# Patient Record
Sex: Male | Born: 2000 | Hispanic: Yes | Marital: Single | Smoking: Never smoker
Health system: Southern US, Community
[De-identification: ages and names within clinical notes are randomized; demographics above are authoritative.]

## PROBLEM LIST (undated history)

## (undated) DIAGNOSIS — J45909 Unspecified asthma, uncomplicated: Secondary | ICD-10-CM

---
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
## 2021-01-14 ENCOUNTER — Emergency Department: Payer: 59

## 2021-01-14 ENCOUNTER — Encounter: Payer: Self-pay | Admitting: Emergency Medicine

## 2021-01-14 ENCOUNTER — Emergency Department
Admission: EM | Admit: 2021-01-14 | Discharge: 2021-01-14 | Disposition: A | Discharge status: Home / Self Care | Payer: 59 | Attending: Emergency Medicine | Admitting: Emergency Medicine

## 2021-01-14 ENCOUNTER — Other Ambulatory Visit: Payer: Self-pay

## 2021-01-14 DIAGNOSIS — M7989 Other specified soft tissue disorders: Secondary | ICD-10-CM | POA: Diagnosis not present

## 2021-01-14 DIAGNOSIS — Y92410 Unspecified street and highway as the place of occurrence of the external cause: Secondary | ICD-10-CM | POA: Diagnosis not present

## 2021-01-14 DIAGNOSIS — S6991XA Unspecified injury of right wrist, hand and finger(s), initial encounter: Secondary | ICD-10-CM | POA: Diagnosis not present

## 2021-01-14 DIAGNOSIS — M79641 Pain in right hand: Secondary | ICD-10-CM | POA: Diagnosis not present

## 2021-01-14 DIAGNOSIS — J45909 Unspecified asthma, uncomplicated: Secondary | ICD-10-CM | POA: Insufficient documentation

## 2021-01-14 DIAGNOSIS — S60311A Abrasion of right thumb, initial encounter: Secondary | ICD-10-CM | POA: Diagnosis not present

## 2021-01-14 DIAGNOSIS — S80211A Abrasion, right knee, initial encounter: Secondary | ICD-10-CM | POA: Diagnosis not present

## 2021-01-14 DIAGNOSIS — S50812A Abrasion of left forearm, initial encounter: Secondary | ICD-10-CM | POA: Insufficient documentation

## 2021-01-14 DIAGNOSIS — Z23 Encounter for immunization: Secondary | ICD-10-CM | POA: Insufficient documentation

## 2021-01-14 DIAGNOSIS — M25561 Pain in right knee: Secondary | ICD-10-CM | POA: Diagnosis not present

## 2021-01-14 MED ORDER — ACETAMINOPHEN 500 MG PO TABS
1000.0000 mg | ORAL_TABLET | Freq: Once | ORAL | Status: AC
  Administered 2021-01-14: 1000 mg via ORAL
  Filled 2021-01-14: qty 2

## 2021-01-14 MED ORDER — TETANUS-DIPHTH-ACELL PERTUSSIS 5-2.5-18.5 LF-MCG/0.5 IM SUSY
0.5000 mL | PREFILLED_SYRINGE | Freq: Once | INTRAMUSCULAR | Status: AC
  Administered 2021-01-14: 0.5 mL via INTRAMUSCULAR
  Filled 2021-01-14: qty 0.5

## 2021-01-14 NOTE — ED Notes (Signed)
Wound to right thumb cleansed prior to splint application. Non adherent dressing applied. Splint applied by Misty Stanley, EDNT. CMS WNL to right hand post thumb spica splint. Patient denies discomfort. Patient wound to left arm cleansed and wrapped by this RN as well.
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
## 2021-01-14 NOTE — Discharge Instructions (Addendum)
Your x-ray was negative for fracture although I am concerned about the possibility of a scaphoid fracture given your tenderness.  You need to leave your splint on and follow-up with orthopedic surgery in 2 to 3 weeks for repeat x-ray.  Return to the ER if you develop headaches, nausea, vomiting or any other concern  Take Tylenol 1 g every 8 hours and ibuprofen 600 every 6 hours with food.  Return the ER for any other concern

## 2021-01-14 NOTE — ED Notes (Signed)
ED Provider at bedside. 
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
## 2021-12-11 DIAGNOSIS — Z00129 Encounter for routine child health examination without abnormal findings: Secondary | ICD-10-CM | POA: Diagnosis not present

## 2022-01-13 DIAGNOSIS — M7989 Other specified soft tissue disorders: Secondary | ICD-10-CM | POA: Diagnosis not present

## 2022-01-13 DIAGNOSIS — M25471 Effusion, right ankle: Secondary | ICD-10-CM | POA: Diagnosis not present

## 2022-01-13 DIAGNOSIS — M79671 Pain in right foot: Secondary | ICD-10-CM | POA: Diagnosis not present

## 2022-01-13 DIAGNOSIS — M25571 Pain in right ankle and joints of right foot: Secondary | ICD-10-CM | POA: Diagnosis not present

## 2022-01-14 DIAGNOSIS — M25571 Pain in right ankle and joints of right foot: Secondary | ICD-10-CM | POA: Diagnosis not present
